# Patient Record
Sex: Female | Born: 1985 | Race: White | Hispanic: No | Marital: Single | Smoking: Never smoker
Health system: Southern US, Community
[De-identification: ages and names within clinical notes are randomized; demographics above are authoritative.]

## PROBLEM LIST (undated history)

## (undated) DIAGNOSIS — F32A Depression, unspecified: Secondary | ICD-10-CM

## (undated) DIAGNOSIS — F329 Major depressive disorder, single episode, unspecified: Secondary | ICD-10-CM

---

## 2010-12-15 ENCOUNTER — Ambulatory Visit (INDEPENDENT_AMBULATORY_CARE_PROVIDER_SITE_OTHER): Payer: BC Managed Care – PPO

## 2010-12-15 DIAGNOSIS — R059 Cough, unspecified: Secondary | ICD-10-CM

## 2010-12-15 DIAGNOSIS — J209 Acute bronchitis, unspecified: Secondary | ICD-10-CM

## 2010-12-15 DIAGNOSIS — R05 Cough: Secondary | ICD-10-CM

## 2013-01-18 ENCOUNTER — Encounter (HOSPITAL_COMMUNITY): Payer: Self-pay | Admitting: Emergency Medicine

## 2013-01-18 ENCOUNTER — Emergency Department (HOSPITAL_COMMUNITY)
Admission: EM | Admit: 2013-01-18 | Discharge: 2013-01-18 | Disposition: A | Discharge status: Home / Self Care | Payer: No Typology Code available for payment source | Attending: Emergency Medicine | Admitting: Emergency Medicine

## 2013-01-18 DIAGNOSIS — R0602 Shortness of breath: Secondary | ICD-10-CM | POA: Insufficient documentation

## 2013-01-18 DIAGNOSIS — R51 Headache: Secondary | ICD-10-CM | POA: Insufficient documentation

## 2013-01-18 DIAGNOSIS — Z7729 Contact with and (suspected ) exposure to other hazardous substances: Secondary | ICD-10-CM

## 2013-01-18 DIAGNOSIS — R42 Dizziness and giddiness: Secondary | ICD-10-CM | POA: Insufficient documentation

## 2013-01-18 DIAGNOSIS — Z77098 Contact with and (suspected) exposure to other hazardous, chiefly nonmedicinal, chemicals: Secondary | ICD-10-CM | POA: Insufficient documentation

## 2013-01-18 DIAGNOSIS — R61 Generalized hyperhidrosis: Secondary | ICD-10-CM | POA: Insufficient documentation

## 2013-01-18 DIAGNOSIS — R002 Palpitations: Secondary | ICD-10-CM | POA: Insufficient documentation

## 2013-01-18 DIAGNOSIS — Z8659 Personal history of other mental and behavioral disorders: Secondary | ICD-10-CM | POA: Insufficient documentation

## 2013-01-18 HISTORY — DX: Depression, unspecified: F32.A

## 2013-01-18 HISTORY — DX: Major depressive disorder, single episode, unspecified: F32.9

## 2013-01-18 LAB — CARBOXYHEMOGLOBIN
Carboxyhemoglobin: 1.1 % (ref 0.5–1.5)
Methemoglobin: 1.2 % (ref 0.0–1.5)
O2 SAT: 40.4 %
TOTAL HEMOGLOBIN: 15.2 g/dL (ref 12.0–16.0)

## 2013-01-18 NOTE — ED Notes (Signed)
Pt. Stated, I've had a gas leak in my apartment and wanted to find out if its anyway harmed me.

## 2013-01-18 NOTE — Discharge Instructions (Signed)
1. Medications: usual home medications °2. Treatment: rest, drink plenty of fluids,  °3. Follow Up: Please followup with your primary doctor for discussion of your diagnoses and further evaluation after today's visit; if you do not have a primary care doctor use the resource guide provided to find one;  ° ° °Emergency Department Resource Guide °1) Find a Doctor and Pay Out of Pocket °Although you won't have to find out who is covered by your insurance plan, it is a good idea to ask around and get recommendations. You will then need to call the office and see if the doctor you have chosen will accept you as a new patient and what types of options they offer for patients who are self-pay. Some doctors offer discounts or will set up payment plans for their patients who do not have insurance, but you will need to ask so you aren't surprised when you get to your appointment. ° °2) Contact Your Local Health Department °Not all health departments have doctors that can see patients for sick visits, but many do, so it is worth a call to see if yours does. If you don't know where your local health department is, you can check in your phone book. The CDC also has a tool to help you locate your state's health department, and many state websites also have listings of all of their local health departments. ° °3) Find a Walk-in Clinic °If your illness is not likely to be very severe or complicated, you may want to try a walk in clinic. These are popping up all over the country in pharmacies, drugstores, and shopping centers. They're usually staffed by nurse practitioners or physician assistants that have been trained to treat common illnesses and complaints. They're usually fairly quick and inexpensive. However, if you have serious medical issues or chronic medical problems, these are probably not your best option. ° °No Primary Care Doctor: °- Call [facility] at  832-8000 - they can help you locate a primary care doctor that   accepts your insurance, provides certain services, etc. °- Physician Referral Service- 1-800-533-3463 ° °Chronic Pain Problems: °Organization         Address  Phone   Notes  °[facility]  (336) 297-2271 Patients need to be referred by their primary care doctor.  ° °Medication Assistance: °Organization         Address  Phone   Notes  °[place] [facility] [address] °[place], [place] [address] (336) 641-8030 --Must be a resident of [place] °-- Must have NO insurance coverage whatsoever (no Medicaid/ Medicare, etc.) °-- The pt. MUST have a primary care doctor that directs their care regularly and follows them in the community °  °[facility]  (866) 331-1348   °[employer]  (888) 892-1162   ° °Agencies that provide inexpensive medical care: °Organization         Address  Phone   Notes  °[facility]  (336) 832-8035   °[facility]    (336) 832-7272   °[facility] [address] °[place], [place] [address] (336) 832-4777   °[facility] [address], °[place] (336) 271-4999   °[facility]    (336) 373-0678   °[facility]    (336) 272-1050   °[facility] ° [address],  Phone:  (336) 832-4444, Fax:  (336) 832-4440 Hours of Operation:    9 am - 6 pm, M-F.  Also accepts Medicaid/Medicare and self-pay.  °[facility] ° [address], [place] Phone: (336) 832-3150, Fax: (336) 832-3151. Hours of Operation:  8:30 am - 5:30 pm, M-F.  Also accepts Medicaid and self-pay.  °[facility] [place] [address], [place] Phone: (336) 878-6027   °[facility] [address], [place], [place] (336)723-1848, Ext. 123 Mondays & Thursdays: 7-9 AM.  First 15 patients are seen on a first come, first serve basis. °  ° °Medicaid-accepting [place] Providers: ° °Organization          Address  Phone   Notes  °[facility] [address], [place] (336) 641-2100 Also accepts self-pay patients.  °[facility] [address], [place] ° (336) 856-9996   °[facility] [address], [place] (336) 288-8857   °[facility] [address] [place] [address], [place] (336) 299-7000   °Veita Bland [address], [place]  ° (336) 373-1557 Only accepts [place] Access Medicaid patients after they have their name applied to their card.  ° °Self-Pay (no insurance) in [place]: ° °Organization         Address  Phone   Notes  °Sickle Cell Patients, [facility] [address], [place] (336) 832-1970   °[facility] [address], [place] (336) 832-4400   °[facility] ° [address], [place] (336) 992-4800   °[facility]/Dr. Osei-Bonsu ° [address] [place] [address], [place] or [address], [place] (336) 841-8500 Phone number for both [place] and [place] locations is the same.  °[facility] [address], [place] (336) 299-0000   °[facility] [address] [place] [address], [place] or [address] (336) 852-7530 °(336) 878-2260   °[facility] [address], [place] (336) 350-1642, phone; (336) 294-5005, fax Sees patients 1st and 3rd Saturday of every month.  Must not qualify for public or private insurance (i.e. Medicaid, Medicare, [place] Health Choice, Veterans' Benefits) • Household income should be no more than 200% of the poverty level •The clinic cannot treat you if you are pregnant or think you are pregnant • Sexually transmitted diseases are not treated at the clinic.  ° ° °Dental Care: °Organization         Address  Phone  Notes  °[facility] [address],  [place] (336) 641-6152 Accepts children up to age 21 who are enrolled in Medicaid or [place] Health Choice; pregnant women with a Medicaid card; and children who have applied for Medicaid or [place] Health Choice, but were declined, whose parents can pay a reduced fee at time of service.  °[facility] [place]  [address], [place] (336) 641-7733 Accepts children up to age 21 who are enrolled in Medicaid or [place] Health Choice; pregnant women with a Medicaid card; and children who have applied for Medicaid or [place] Health Choice, but were declined, whose parents can pay a reduced fee at time of service.  °[facility] ° [address], [place] (336) 641-4533 Patients are seen by appointment only. Walk-ins are not accepted. [facility] will see patients 18 years   of age and older. °Monday - Tuesday (8am-5pm) °Most Wednesdays (8:30-5pm) °$30 per visit, cash only  °[facility] ° [address], [place] (336) 641-4533 Patients are seen by appointment only. Walk-ins are not accepted. [facility] will see patients 18 years of age and older. °One Wednesday Evening (Monthly: Volunteer Based).  $30 per visit, cash only  °[facility]  (919) 537-3737 for adults; Children under age 4, call Graduate Pediatric Dentistry at (919) 537-3956. Children aged 4-14, please call (919) 537-3737 to request a pediatric application. ° Dental services are provided in all areas of dental care including fillings, crowns and bridges, complete and partial dentures, implants, gum treatment, root canals, and extractions. Preventive care is also provided. Treatment is provided to both adults and children. °Patients are selected via a lottery and there is often a waiting list. °  °[facility] [address], °[place] ° (336) 763-8833 [email or web address] °  °[facility] [address], [place], [place]  (336)723-1848, Ext. 123 Second and Fourth Thursday of each month, opens at 6:30 AM; Clinic ends at 9 AM.  Patients are seen on a first-come first-served basis, and a limited number are seen during each clinic.  ° °[facility] ° [address], [place], [place] (336) 723-7904   Eligibility Requirements °You must have lived in [place], [place], or [place] counties for at least the last three months. °  You cannot be eligible for state or federal sponsored healthcare insurance, including Veterans Administration, Medicaid, or Medicare. °  You generally cannot be eligible for healthcare insurance through your employer.  °  How to apply: °Eligibility screenings are held every Tuesday and Wednesday afternoon from 1:00 pm until 4:00 pm. You do not need an appointment for the interview!  °[facility] [address], [place], [place] 336-631-2330   °[facility]  336-342-8273   °[facility]  336-703-3100   °[facility]  336-570-6415   ° °Behavioral Health Resources in the Community: °Intensive Outpatient Programs °Organization         Address  Phone  Notes  °[facility] [address], [place], [place] 336-878-6098   °[facility] [address], [place], [place] 336-832-9800   °[facility] [address], [place], [place] ° 336-882-2125   °[facility] [address],  °[place], [place] 1-800-853-5163 or 336-641-4981   °Substance Abuse Resources °Organization         Address  Phone  Notes  °[facility]  336-882-2125   °[facility]  336-784-9470   °[facility]  336-285-9073   °[facility]  336-845-3988   °Residential & Outpatient Substance Abuse Program  1-800-659-3381   °Psychological Services °Organization         Address  Phone  Notes  °[facility]  336- 832-9600   °[facility]  336- 378-7881    °[facility] [address], [place] 1-800-853-5163 or 336-641-4981   ° °Mobile Crisis Teams °Organization         Address  Phone  Notes  °[facility], Mobile Crisis Care Unit  1-877-626-1772   °Assertive °[facility] ° [address] [place], [place] 336-834-9664   °Sharon DeEsch [address] °[place]  336-554-5454   ° °Self-Help/Support Groups °Organization           Address  Phone             Notes  °[facility] [place] - variety of support groups  336- 373-1402 Call for more information  °Narcotics Anonymous (NA), [facility] [address], °[place]  2 meetings at this location  ° °Residential Treatment Programs °Organization         Address  Phone  Notes  °[facility] [address],    °[place]  1-866-801-8205   °[facility] ° [address], [place], [place] 704-293-8524   °[facility] [address], [place] 336-845-3988 Admissions: 8am-3pm M-F  °[facility] [address]    °[place], [place] 336-841-1104   °[facility] [address], [place], [place] 336-379-7146   °[facility] [address]  °[place], [place] 336-285-9073   °[facility] - Intensive Outpatient [address], [place], [place] 336-852-3033   °[facility] [address]  °[place], [place] 1-877-615-2722 or 336-784-9470   °[facility] [address] [place], [place] 336-227-7417 Accepts Medicaid  °[facility] [address]  ° [place] 1-800-659-3381 Substance Abuse/Addiction Treatment  ° °[place] Behavioral Health Resources °Organization         Address  Phone  Notes  °[facility]  (888) 581-9988   °Julie Brannon, PhD [address] [place], [place]   (336) 349-5553 or (336) 951-0000   °[facility]   [address] °[place], [place] (336) 349-4454   °[facility] [address], [place], [place] (336) 342-8316 Insurance/Medicaid/sponsorship through [facility]  °[facility] [address]                                    [place], [place] (336) 342-8316 Therapy/tele-psych/case  °[facility] [address]  ° [place], [place] (336) 349-2233    °Dr. Arfeen  (336) 349-4544   °[facility] [place]  [employer] [place] [facility] 1) [address], [place] °2) [address], [place] °3)  [address], [place] (336) 349-3220 °(336) 342-7768 ° °(336) 342-8140   °[place] [facility] (336) 342-1394 or (336) 342-3537 (After Hours)    ° ° ° ° °

## 2013-01-18 NOTE — ED Notes (Signed)
Smelled gas 3 months ago inapt and then she smelled it again  ( never stopped ) and  Her apt mx emailed her that she had leak in her ovr and stove she has been feeling bad  Every since

## 2013-01-18 NOTE — ED Provider Notes (Signed)
CSN: [id number]     Arrival date & time 01/18/13  1405 History   This chart was scribed for non-physician practitioner Dierdre ForthHannah Brezlyn Manrique, PA-C working with Flint MelterElliott L Wentz, MD by Donne Anonayla Curran, ED Scribe. This patient was seen in room TR04C/TR04C and the patient's care was started at 1729.  First MD Initiated Contact with Patient 01/18/13 1729     No chief complaint on file.   The history is provided by the patient and medical records. No language interpreter was used.   HPI Comments: Kathy Simmons is a 28 y.o. female who presents to the Emergency Department complaining of possible carbon monoxide poisoning. She states she began smelling gas in her apartment in October and the apartment was evaluated and was negative for a gas leak. She received an e-mail from her landlord today and states that her apartment did have a gas leak that has since been fixed. She states she hasn't been feeling herself since October. She reports associated intermittent, moderate HA, diaphoresis, palpitations, SOB with exertion, dizziness, and vision changes. She reports feelings of lightheadedness but denies syncope. She denies emesis, CP or any other pain. She states she hasn't changed diet recently. She has not tried any medication for her symptoms. She denies any other medical conditions or daily medication.   Past Medical History  Diagnosis Date  . Depression    History reviewed. No pertinent past surgical history. No family history on file. History  Substance Use Topics  . Smoking status: Never Smoker   . Smokeless tobacco: Not on file  . Alcohol Use: Yes   OB History   Grav Para Term Preterm Abortions TAB SAB Ect Mult Living                 Review of Systems  Constitutional: Positive for diaphoresis. Negative for fever, appetite change, fatigue and unexpected weight change.  HENT: Negative for mouth sores.   Eyes: Negative for visual disturbance.  Respiratory: Positive for shortness of breath.  Negative for cough, chest tightness and wheezing.   Cardiovascular: Positive for palpitations. Negative for chest pain.  Gastrointestinal: Negative for nausea, vomiting, abdominal pain, diarrhea and constipation.  Endocrine: Negative for polydipsia, polyphagia and polyuria.  Genitourinary: Negative for dysuria, urgency, frequency and hematuria.  Musculoskeletal: Negative for back pain and neck stiffness.  Skin: Negative for rash.  Allergic/Immunologic: Negative for immunocompromised state.  Neurological: Positive for dizziness, light-headedness and headaches. Negative for syncope.  Hematological: Does not bruise/bleed easily.  Psychiatric/Behavioral: Negative for sleep disturbance. The patient is not nervous/anxious.     Allergies  Other  Home Medications  No current outpatient prescriptions on file.  BP 143/78  Pulse 84  Temp(Src) 98 F (36.7 C) (Oral)  Resp 20  Ht 5\' 6"  (1.676 m)  Wt 150 lb (68.04 kg)  BMI 24.22 kg/m2  SpO2 99%  Physical Exam  Nursing note and vitals reviewed. Constitutional: She is oriented to person, place, and time. She appears well-developed and well-nourished. No distress.  Awake, alert, nontoxic appearance  HENT:  Head: Normocephalic and atraumatic.  Mouth/Throat: Oropharynx is clear and moist. No oropharyngeal exudate.  Eyes: Conjunctivae are normal. Pupils are equal, round, and reactive to light. No scleral icterus.  Neck: Normal range of motion. Neck supple.  Cardiovascular: Normal rate, regular rhythm, normal heart sounds and intact distal pulses.   No murmur heard. Capillary refill < 3 seconds.  Pulmonary/Chest: Effort normal and breath sounds normal. No respiratory distress. She has no wheezes.  Abdominal: Soft.  Bowel sounds are normal. She exhibits no mass. There is no tenderness. There is no rebound and no guarding.  Musculoskeletal: Normal range of motion. She exhibits no edema.  Lymphadenopathy:    She has no cervical adenopathy.   Neurological: She is alert and oriented to person, place, and time. She exhibits normal muscle tone. Coordination normal.  Speech is clear and goal oriented Moves extremities without ataxia  Skin: Skin is warm and dry. She is not diaphoretic. No erythema.  No erythema the skin  Psychiatric: She has a normal mood and affect. Her behavior is normal.    ED Course  Procedures (including critical care time) DIAGNOSTIC STUDIES: Oxygen Saturation is 99% on RA, normal by my interpretation.    COORDINATION OF CARE: 6:03 PM Discussed treatment plan with pt at bedside and pt agreed to plan. Discussed lab results. Will give resource guide. Advised pt to follow up with a PCP. Return precautions advised.   Labs Review Labs Reviewed  CARBOXYHEMOGLOBIN   Imaging Review No results found.  EKG Interpretation   None       MDM   1. Exposure to carbon monoxide      Kathy Simmons presents with concerns after a carbon monoxide exposure today in her apartment.  Patient with normal physical exam findings; clear and equal breath sounds, no skin erythema.  Patient with significant number of positive review of systems but is asymptomatic at this time. Carboxyhemoglobin 1.1 and patient without anemia.  No evidence of carbon monoxide poisoning. Patient strongly cautioned and given resources for primary care followup.  It has been determined that no acute conditions requiring further emergency intervention are present at this time. The patient/guardian have been advised of the diagnosis and plan. We have discussed signs and symptoms that warrant return to the ED, such as changes or worsening in symptoms.   Vital signs are stable at discharge.   BP 143/78  Pulse 84  Temp(Src) 98 F (36.7 C) (Oral)  Resp 20  Ht 5\' 6"  (1.676 m)  Wt 150 lb (68.04 kg)  BMI 24.22 kg/m2  SpO2 99%  Patient/guardian has voiced understanding and agreed to follow-up with the PCP or specialist.    I personally  performed the services described in this documentation, which was scribed in my presence. The recorded information has been reviewed and is accurate.   1. Medications: usual home medications 2. Treatment: rest, drink plenty of fluids,  3. Follow Up: Please use the resource guide provided to find a PCP  Dierdre Forth, PA-C 01/18/13 1832

## 2013-01-19 NOTE — ED Provider Notes (Signed)
Medical screening examination/treatment/procedure(s) were performed by non-physician practitioner and as supervising physician I was immediately available for consultation/collaboration.  Hamlet Lasecki L Nikole Swartzentruber, MD 01/19/13 0025 

## 2016-02-25 DIAGNOSIS — N926 Irregular menstruation, unspecified: Secondary | ICD-10-CM | POA: Diagnosis not present

## 2016-02-25 DIAGNOSIS — R102 Pelvic and perineal pain: Secondary | ICD-10-CM | POA: Diagnosis not present

## 2016-08-17 DIAGNOSIS — N9489 Other specified conditions associated with female genital organs and menstrual cycle: Secondary | ICD-10-CM | POA: Diagnosis not present

## 2016-08-20 DIAGNOSIS — Z3202 Encounter for pregnancy test, result negative: Secondary | ICD-10-CM | POA: Diagnosis not present

## 2016-08-20 DIAGNOSIS — R102 Pelvic and perineal pain: Secondary | ICD-10-CM | POA: Diagnosis not present

## 2016-09-17 DIAGNOSIS — R102 Pelvic and perineal pain: Secondary | ICD-10-CM | POA: Diagnosis not present

## 2017-09-08 DIAGNOSIS — Z0000 Encounter for general adult medical examination without abnormal findings: Secondary | ICD-10-CM | POA: Diagnosis not present

## 2017-09-08 DIAGNOSIS — Z13220 Encounter for screening for lipoid disorders: Secondary | ICD-10-CM | POA: Diagnosis not present

## 2017-09-08 DIAGNOSIS — Z6825 Body mass index (BMI) 25.0-25.9, adult: Secondary | ICD-10-CM | POA: Diagnosis not present

## 2017-12-13 DIAGNOSIS — S060X0A Concussion without loss of consciousness, initial encounter: Secondary | ICD-10-CM | POA: Diagnosis not present

## 2019-11-14 ENCOUNTER — Ambulatory Visit (INDEPENDENT_AMBULATORY_CARE_PROVIDER_SITE_OTHER): Payer: Self-pay | Admitting: Sports Medicine

## 2019-11-14 ENCOUNTER — Ambulatory Visit
Admission: RE | Admit: 2019-11-14 | Discharge: 2019-11-14 | Disposition: A | Discharge status: Home / Self Care | Payer: No Typology Code available for payment source | Source: Ambulatory Visit | Attending: Sports Medicine | Admitting: Sports Medicine

## 2019-11-14 ENCOUNTER — Other Ambulatory Visit: Payer: Self-pay

## 2019-11-14 ENCOUNTER — Encounter: Payer: Self-pay | Admitting: Sports Medicine

## 2019-11-14 ENCOUNTER — Other Ambulatory Visit: Payer: No Typology Code available for payment source

## 2019-11-14 DIAGNOSIS — Z0189 Encounter for other specified special examinations: Secondary | ICD-10-CM

## 2019-11-14 DIAGNOSIS — Y9315 Activity, underwater diving and snorkeling: Secondary | ICD-10-CM

## 2019-11-14 NOTE — Progress Notes (Signed)
   PCP: No primary care provider on file.  Subjective:   HPI: Patient is a 34 y.o. female here for dive physical.  Patient has no significant past medical history.  Specifically, patient denies any history of panic attacks, barotrauma, seizures, sinus problems, neurologic diagnoses, cancer history, hearing difficulty, or asthma.   Review of Systems:  Per HPI.   PMFSH, medications and smoking status reviewed.      Objective:  Physical Exam:  No flowsheet data found.   Gen: awake, alert, NAD, comfortable in exam room Pulm: breathing unlabored  Dive physical exam: -General: Well-nourished and well-appearing female in NAD.  Normal body habitus without marfanoid features. -HEENT: Vision tested and without abnormalities.  EOMI, PERRL, visual field testing without abnormalities.  Bilateral tympanic membranes without abnormality.  Oropharynx without erythema or abnormality. -Cardiovascular: RRR, no murmurs, gallops or rubs in both the upright and supine position.  Normal radial pulses bilaterally. -Pulmonary: CTAB, no rhonchi or rales. -Abdomen: Normal bowel sounds, nontender, nondistended, no hepatosplenomegaly -Neuro: Cranial nerves II through X intact, normal reflexes, 5/5 strength in all extremities.   -Balance: Negative Romberg, normal tandem, normal single leg and double leg squat, normal finger-to-nose testing, no abnormalities with horizontal and vertical saccades, no abnormalities with fixed gaze      Assessment & Plan:  1.  Dive physical  Patient is a well appearing 34 year old female without any significant past medical history and no contraindications to diving.  Lab work including CBC, lipid panel are within normal limits.  CXR done today shows no abnormalities.  I find no medical conditions that may be disqualified for participation in scuba diving.  Medical paperwork completed today and returned to patient.  Follow-up if concerns.   Guy Sandifer, MD [facility] 11/14/2019 10:00 AM   Patient seen and evaluated with the sports medicine fellow.  I agree with the above plan of care.  Patient is cleared for diving without restriction.  Follow-up as needed.

## 2021-10-21 IMAGING — CR DG CHEST 2V
2 series · 2 of 2 positions shown · non-contrast
Comparison: None.

CLINICAL DATA: Dive physical.

EXAM:
CHEST - 2 VIEW

[w chest pa]
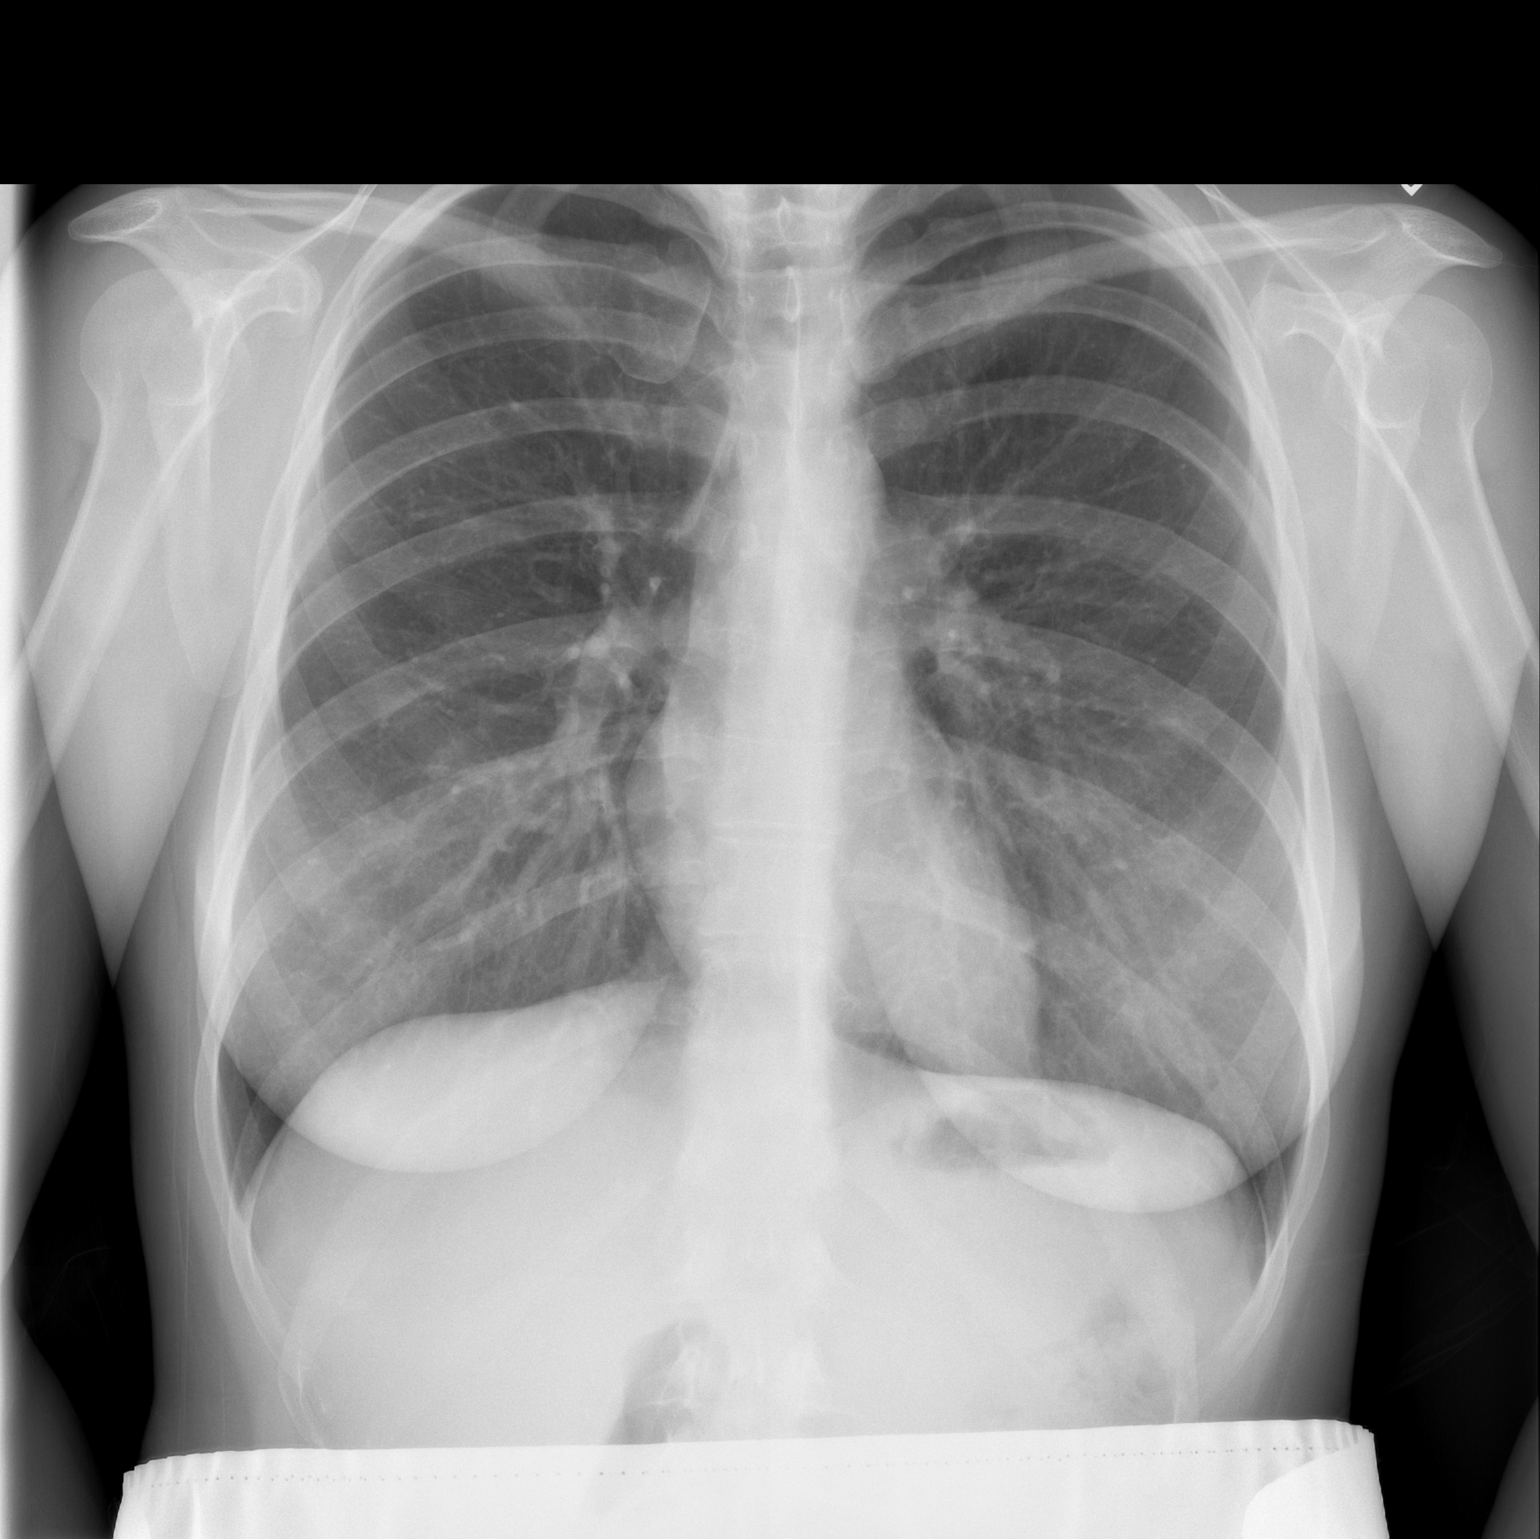

[w chest lat]
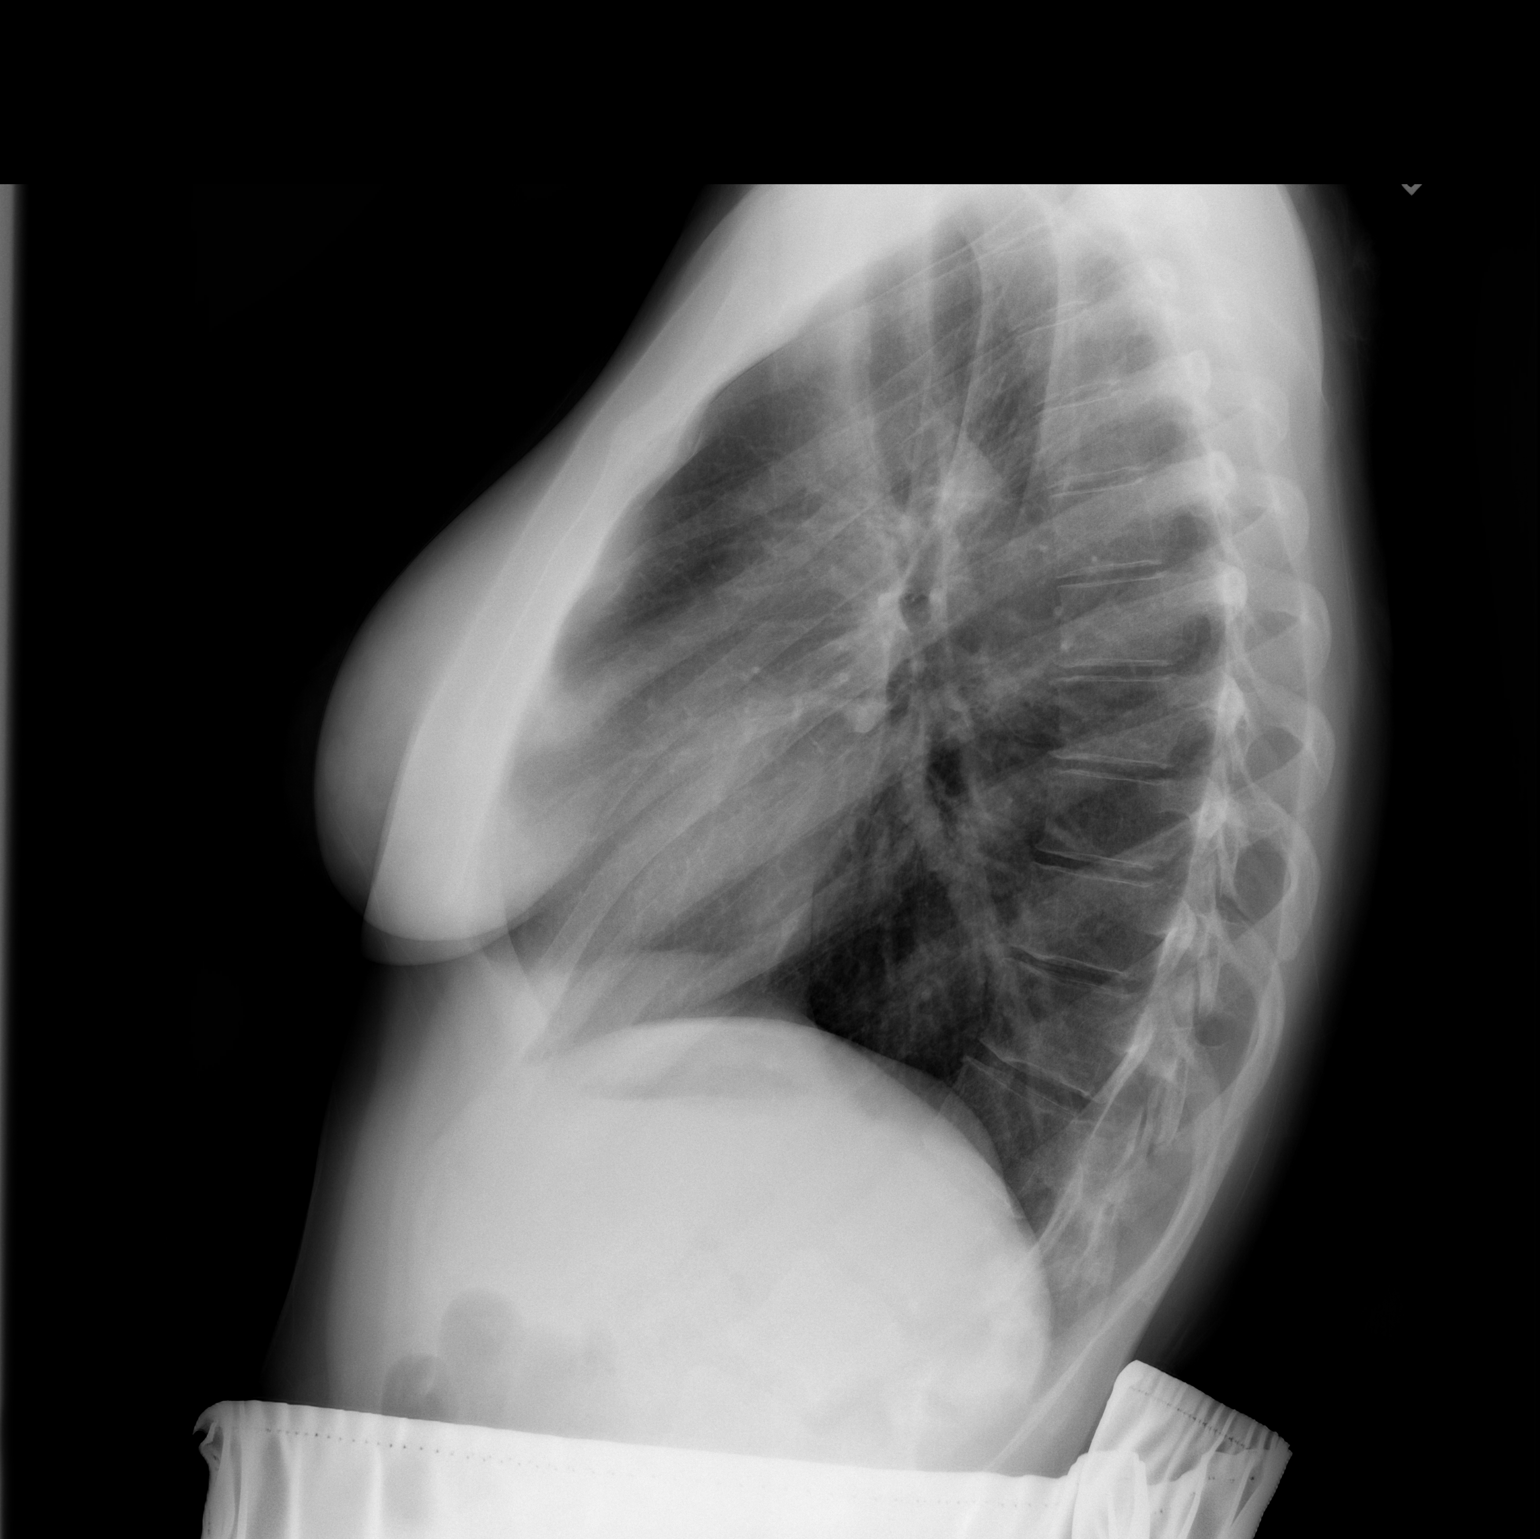

[2 of 2 positions shown; findings below may reference images not displayed]

FINDINGS: The heart size and mediastinal contours are within normal limits.
Both lungs are clear. The visualized skeletal structures are
unremarkable.
IMPRESSION: Normal chest radiographs.
